# Patient Record
Sex: Female | Born: 1970 | Race: White | Hispanic: No | Marital: Married
Health system: Southern US, Community
[De-identification: ages and names within clinical notes are randomized; demographics above are authoritative.]

---

## 2013-09-06 ENCOUNTER — Other Ambulatory Visit (HOSPITAL_COMMUNITY): Payer: Medicare Other

## 2013-09-06 ENCOUNTER — Inpatient Hospital Stay
Admission: AD | Admit: 2013-09-06 | Discharge: 2013-09-28 | Disposition: A | Discharge status: Skilled Nursing Facility | Payer: Medicare Other | Source: Ambulatory Visit | Attending: Internal Medicine | Admitting: Internal Medicine

## 2013-09-06 DIAGNOSIS — J189 Pneumonia, unspecified organism: Secondary | ICD-10-CM

## 2013-09-06 DIAGNOSIS — J9620 Acute and chronic respiratory failure, unspecified whether with hypoxia or hypercapnia: Secondary | ICD-10-CM

## 2013-09-07 LAB — CBC
HEMATOCRIT: 33.8 % — AB (ref 36.0–46.0)
Hemoglobin: 10.5 g/dL — ABNORMAL LOW (ref 12.0–15.0)
MCH: 24 pg — ABNORMAL LOW (ref 26.0–34.0)
MCHC: 31.1 g/dL (ref 30.0–36.0)
MCV: 77.3 fL — AB (ref 78.0–100.0)
PLATELETS: 506 10*3/uL — AB (ref 150–400)
RBC: 4.37 MIL/uL (ref 3.87–5.11)
RDW: 17.9 % — ABNORMAL HIGH (ref 11.5–15.5)
WBC: 10.5 10*3/uL (ref 4.0–10.5)

## 2013-09-07 LAB — BASIC METABOLIC PANEL
BUN: 19 mg/dL (ref 6–23)
CALCIUM: 9.4 mg/dL (ref 8.4–10.5)
CHLORIDE: 100 meq/L (ref 96–112)
CO2: 26 mEq/L (ref 19–32)
Creatinine, Ser: 0.33 mg/dL — ABNORMAL LOW (ref 0.50–1.10)
GFR calc Af Amer: >90 mL/min (ref >90)
GFR calc non Af Amer: >90 mL/min (ref >90)
Glucose, Bld: 121 mg/dL — ABNORMAL HIGH (ref 70–99)
Potassium: 3.9 mEq/L (ref 3.7–5.3)
Sodium: 138 mEq/L (ref 137–147)

## 2013-09-07 LAB — BLOOD GAS, ARTERIAL
ACID-BASE EXCESS: 0.1 mmol/L (ref 0.0–2.0)
Bicarbonate: 24.1 mEq/L — ABNORMAL HIGH (ref 20.0–24.0)
Delivery systems: POSITIVE
Expiratory PAP: 5
FIO2: 30 %
Inspiratory PAP: 10
Mode: POSITIVE
O2 Saturation: 99.1 %
PCO2 ART: 37.2 mmHg (ref 35.0–45.0)
PO2 ART: 116 mmHg — AB (ref 80.0–100.0)
Patient temperature: 97.6
TCO2: 25.3 mmol/L (ref 0–100)
pH, Arterial: 7.424 (ref 7.350–7.450)

## 2013-09-07 LAB — HEPATIC FUNCTION PANEL
ALT: 36 U/L — ABNORMAL HIGH (ref 0–35)
AST: 13 U/L (ref 0–37)
Albumin: 3.4 g/dL — ABNORMAL LOW (ref 3.5–5.2)
Alkaline Phosphatase: 73 U/L (ref 39–117)
BILIRUBIN TOTAL: 0.4 mg/dL (ref 0.3–1.2)
Total Protein: 6.3 g/dL (ref 6.0–8.3)

## 2013-09-07 LAB — PROCALCITONIN: Procalcitonin: <0.1 ng/mL

## 2013-09-07 LAB — SEDIMENTATION RATE: SED RATE: 7 mm/h (ref 0–22)

## 2013-09-07 LAB — C-REACTIVE PROTEIN: CRP: <0.5 mg/dL — ABNORMAL LOW (ref <0.60)

## 2013-09-07 LAB — TSH: TSH: 0.297 u[IU]/mL — AB (ref 0.350–4.500)

## 2013-09-08 LAB — BASIC METABOLIC PANEL
BUN: 22 mg/dL (ref 6–23)
CHLORIDE: 101 meq/L (ref 96–112)
CO2: 30 mEq/L (ref 19–32)
Calcium: 9.3 mg/dL (ref 8.4–10.5)
Creatinine, Ser: 0.35 mg/dL — ABNORMAL LOW (ref 0.50–1.10)
GFR calc non Af Amer: >90 mL/min (ref >90)
Glucose, Bld: 99 mg/dL (ref 70–99)
POTASSIUM: 3.5 meq/L — AB (ref 3.7–5.3)
Sodium: 141 mEq/L (ref 137–147)

## 2013-09-08 LAB — POTASSIUM: POTASSIUM: 3.8 meq/L (ref 3.7–5.3)

## 2013-09-09 LAB — BASIC METABOLIC PANEL
BUN: 23 mg/dL (ref 6–23)
CHLORIDE: 100 meq/L (ref 96–112)
CO2: 31 mEq/L (ref 19–32)
CREATININE: 0.39 mg/dL — AB (ref 0.50–1.10)
Calcium: 9.1 mg/dL (ref 8.4–10.5)
GFR calc non Af Amer: >90 mL/min (ref >90)
Glucose, Bld: 85 mg/dL (ref 70–99)
POTASSIUM: 3.5 meq/L — AB (ref 3.7–5.3)
Sodium: 139 mEq/L (ref 137–147)

## 2013-09-10 NOTE — Progress Notes (Addendum)
[facility]                                                                                              Progress note     Patient Demographics  Lenise ArenaSusan Coletta, is a 43 y.o. female  ZOX:[id number]  WJX:[id number]:[id number]  DOB - 1971/05/05  Admit date - 09/06/2013  Admitting Physician Elnora MorrisonAhmad B Barakat, MD  Outpatient Primary MD for the patient is No PCP Per Patient  LOS - 4   Chief complaint Respiratory failure          Subjective:   Lenise ArenaSusan Redd today complains of lower extremity pains and tingling with mild nausea .   Objective:   Vital signs  Temperature 96.9 Heart rate 83 Respiratory rate 14 Blood pressure 98/74 Pulse ox 97%  Exam Awake Alert, Oriented X 3, No new F.N deficits,  [place].AT,PERRAL Supple Neck,No JVD, No cervical lymphadenopathy appriciated.  Symmetrical Chest wall movement, decreased breath sounds at the bases, mild scattered rhonchi RRR,No Gallops,Rubs or new Murmurs, No Parasternal Heave +ve B.Sounds, Abd Soft, Non tender, No organomegaly appriciated, No rebound - guarding or rigidity. No Cyanosis, Clubbing or edema, No new Rash or bruise     I&Os 1435/300  Foley, no   Data Review   CBC  Recent Labs Lab 09/07/13 0640  WBC 10.5  HGB 10.5*  HCT 33.8*  PLT 506*  MCV 77.3*  MCH 24.0*  MCHC 31.1  RDW 17.9*    Chemistries   Recent Labs Lab 09/07/13 0640 09/08/13 0630 09/08/13 1450 09/09/13 0625  NA 138 141  --  139  K 3.9 3.5* 3.8 3.5*  CL 100 101  --  100  CO2 26 30  --  31  GLUCOSE 121* 99  --  85  BUN 19 22  --  23  CREATININE 0.33* 0.35*  --  0.39*  CALCIUM 9.4 9.3  --  9.1  AST 13  --   --   --   ALT 36*  --   --   --   ALKPHOS 73  --   --   --   BILITOT 0.4  --   --   --      Assessment & Plan   Respiratory failure, acute on 2 L nasal cannula in a.m. 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 Increase OxyIR to 10 mg every 4 hours  Code Status:Full    DVT Prophylaxis  Lovenox   Carron CurieHijazi, Ancelmo Hunt M.D on 09/10/2013 at 9:52 AM

## 2013-09-11 LAB — BASIC METABOLIC PANEL
BUN: 13 mg/dL (ref 6–23)
CO2: 31 mEq/L (ref 19–32)
Calcium: 9.2 mg/dL (ref 8.4–10.5)
Chloride: 99 mEq/L (ref 96–112)
Creatinine, Ser: 0.38 mg/dL — ABNORMAL LOW (ref 0.50–1.10)
GFR calc non Af Amer: >90 mL/min (ref >90)
Glucose, Bld: 87 mg/dL (ref 70–99)
Potassium: 3.5 mEq/L — ABNORMAL LOW (ref 3.7–5.3)
Sodium: 140 mEq/L (ref 137–147)

## 2013-09-11 LAB — BLOOD GAS, ARTERIAL
ACID-BASE EXCESS: 3.8 mmol/L — AB (ref 0.0–2.0)
Bicarbonate: 28.9 mEq/L — ABNORMAL HIGH (ref 20.0–24.0)
Delivery systems: POSITIVE
Expiratory PAP: 5
FIO2: 0.3 %
Inspiratory PAP: 12
MODE: POSITIVE
O2 SAT: 96.6 %
PATIENT TEMPERATURE: 98.6
PCO2 ART: 52.6 mmHg — AB (ref 35.0–45.0)
TCO2: 30.5 mmol/L (ref 0–100)
pH, Arterial: 7.359 (ref 7.350–7.450)
pO2, Arterial: 76.7 mmHg — ABNORMAL LOW (ref 80.0–100.0)

## 2013-09-11 LAB — CBC
HEMATOCRIT: 34.3 % — AB (ref 36.0–46.0)
Hemoglobin: 10.5 g/dL — ABNORMAL LOW (ref 12.0–15.0)
MCH: 24.6 pg — ABNORMAL LOW (ref 26.0–34.0)
MCHC: 30.6 g/dL (ref 30.0–36.0)
MCV: 80.5 fL (ref 78.0–100.0)
Platelets: 414 10*3/uL — ABNORMAL HIGH (ref 150–400)
RBC: 4.26 MIL/uL (ref 3.87–5.11)
RDW: 18.8 % — AB (ref 11.5–15.5)
WBC: 13.1 10*3/uL — ABNORMAL HIGH (ref 4.0–10.5)

## 2013-09-11 NOTE — Progress Notes (Signed)
[facility]                                                                                              Progress note     Patient Demographics  Lauren Alvarado, is a 43 y.o. female  YNW:[id number]  MVH:[id number]  DOB - 04-05-1971  Admit date - 09/06/2013  Admitting Physician Elnora MorrisonAhmad B Barakat, MD  Outpatient Primary MD for the patient is No PCP Per Patient  LOS - 5   Chief complaint Respiratory failure          Subjective:   Lauren Alvarado today reports improvement in her pain and nausea .   Objective:   Vital signs  Temperature 96.9  Heart rate 83 Respiratory rate 14 Blood pressure 98/74 Pulse ox 97%  Exam Awake Alert, Oriented X 3, No new F.N deficits,  [place].AT,PERRAL Supple Neck,No JVD, No cervical lymphadenopathy appriciated.  Symmetrical Chest wall movement, decreased breath sounds at the bases, mild scattered rhonchi RRR,No Gallops,Rubs or new Murmurs, No Parasternal Heave +ve B.Sounds, Abd Soft, Non tender, No organomegaly appriciated, No rebound - guarding or rigidity. No Cyanosis, Clubbing or edema, No new Rash or bruise     I&Os 1435/300  Foley, no   Data Review   CBC  Recent Labs Lab 09/07/13 0640 09/11/13 0800  WBC 10.5 13.1*  HGB 10.5* 10.5*  HCT 33.8* 34.3*  PLT 506* 414*  MCV 77.3* 80.5  MCH 24.0* 24.6*  MCHC 31.1 30.6  RDW 17.9* 18.8*    Chemistries   Recent Labs Lab 09/07/13 0640 09/08/13 0630 09/08/13 1450 09/09/13 0625 09/11/13 0800  NA 138 141  --  139 140  K 3.9 3.5* 3.8 3.5* 3.5*  CL 100 101  --  100 99  CO2 26 30  --  31 31  GLUCOSE 121* 99  --  85 87  BUN 19 22  --  23 13  CREATININE 0.33* 0.35*  --  0.39* 0.38*  CALCIUM 9.4 9.3  --  9.1 9.2  AST 13  --   --   --   --   ALT 36*  --   --   --   --   ALKPHOS 73  --   --   --   --   BILITOT 0.4  --   --   --   --      Assessment & Plan   Respiratory failure, acute on 2 L nasal  cannula in a.m. 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

## 2013-09-12 NOTE — Progress Notes (Signed)
[facility]                                                                                              Progress note     Patient Demographics  Lauren Alvarado, is a 43 y.o. female  ZOX:[id number]  WJX:[id number]  DOB - [birth year]  Admit date - 09/06/2013  Admitting Physician Lauren MorrisonAhmad B Barakat, MD  Outpatient Primary MD for the patient is No PCP Per Patient  LOS - 6   Chief complaint Respiratory failure          Subjective:   Lauren Alvarado today reports improvement in her pain and nausea .   Objective:   Vital signs  Temperature 96.30  Heart rate 83 Respiratory rate 14 Blood pressure 118/72 Pulse ox 99%  Exam Awake Alert, Oriented X 3, No new F.N deficits,  [place].AT,PERRAL Supple Neck,No JVD, No cervical lymphadenopathy appriciated.  Symmetrical Chest wall movement, decreased breath sounds at the bases, mild scattered rhonchi RRR,No Gallops,Rubs or new Murmurs, No Parasternal Heave +ve B.Sounds, Abd Soft, Non tender, No organomegaly appriciated, No rebound - guarding or rigidity. No Cyanosis, Clubbing or edema, No new Rash or bruise     I&Os unknown  Foley, no   Data Review   CBC  Recent Labs Lab 09/07/13 0640 09/11/13 0800  WBC 10.5 13.1*  HGB 10.5* 10.5*  HCT 33.8* 34.3*  PLT 506* 414*  MCV 77.3* 80.5  MCH 24.0* 24.6*  MCHC 31.1 30.6  RDW 17.9* 18.8*    Chemistries   Recent Labs Lab 09/07/13 0640 09/08/13 0630 09/08/13 1450 09/09/13 0625 09/11/13 0800  NA 138 141  --  139 140  K 3.9 3.5* 3.8 3.5* 3.5*  CL 100 101  --  100 99  CO2 26 30  --  31 31  GLUCOSE 121* 99  --  85 87  BUN 19 22  --  23 13  CREATININE 0.33* 0.35*  --  0.39* 0.38*  CALCIUM 9.4 9.3  --  9.1 9.2  AST 13  --   --   --   --   ALT 36*  --   --   --   --   ALKPHOS 73  --   --   --   --   BILITOT 0.4  --   --   --   --      Assessment & Plan   Respiratory failure, acute on 2 L  nasal cannula in a.m. 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

## 2013-09-13 NOTE — Progress Notes (Signed)
[facility]                                                                                              Progress note     Patient Demographics  Lauren Alvarado, is a 43 y.o. female  BJY:[name]  YQM:[id number]:[id number]  DOB - Mar 21, 1971  Admit date - 09/06/2013  Admitting Physician Elnora MorrisonAhmad B Barakat, MD  Outpatient Primary MD for the patient is No PCP Per Patient  LOS - 7   Chief complaint Respiratory failure          Subjective:   Lauren ArenaSusan Foutz today reports improvement in her pain and nausea . However complains of bilateral knee pain today  Objective:   Vital signs  Temperature 97.60  Heart rate 105 Respiratory rate 16 Blood pressure 102/68 Pulse ox 97%  Exam Awake Alert, Oriented X 3, No new F.N deficits,  [place].AT,PERRAL Supple Neck,No JVD, No cervical lymphadenopathy appriciated.  Symmetrical Chest wall movement, decreased breath sounds at the bases, mild scattered rhonchi RRR,No Gallops,Rubs or new Murmurs, No Parasternal Heave +ve B.Sounds, Abd Soft, Non tender, No organomegaly appriciated, No rebound - guarding or rigidity. No Cyanosis, Clubbing or edema, No new Rash or bruise   Knee is examined no effusion noted , no hotness or redness   I&Os 1250/?? Foley, no   Data Review   CBC  Recent Labs Lab 09/07/13 0640 09/11/13 0800  WBC 10.5 13.1*  HGB 10.5* 10.5*  HCT 33.8* 34.3*  PLT 506* 414*  MCV 77.3* 80.5  MCH 24.0* 24.6*  MCHC 31.1 30.6  RDW 17.9* 18.8*    Chemistries   Recent Labs Lab 09/07/13 0640 09/08/13 0630 09/08/13 1450 09/09/13 0625 09/11/13 0800  NA 138 141  --  139 140  K 3.9 3.5* 3.8 3.5* 3.5*  CL 100 101  --  100 99  CO2 26 30  --  31 31  GLUCOSE 121* 99  --  85 87  BUN 19 22  --  23 13  CREATININE 0.33* 0.35*  --  0.39* 0.38*  CALCIUM 9.4 9.3  --  9.1 9.2  AST 13  --   --   --   --   ALT 36*  --   --   --   --   ALKPHOS 73  --   --   --    --   BILITOT 0.4  --   --   --   --      Assessment & Plan   Respiratory failure, acute on 2 L nasal cannula in a.m. 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

## 2013-09-14 LAB — CBC
HEMATOCRIT: 33.5 % — AB (ref 36.0–46.0)
Hemoglobin: 10.5 g/dL — ABNORMAL LOW (ref 12.0–15.0)
MCH: 25.1 pg — ABNORMAL LOW (ref 26.0–34.0)
MCHC: 31.3 g/dL (ref 30.0–36.0)
MCV: 80.1 fL (ref 78.0–100.0)
Platelets: 362 10*3/uL (ref 150–400)
RBC: 4.18 MIL/uL (ref 3.87–5.11)
RDW: 18.9 % — AB (ref 11.5–15.5)
WBC: 24.6 10*3/uL — AB (ref 4.0–10.5)

## 2013-09-14 LAB — MAGNESIUM: Magnesium: 1.8 mg/dL (ref 1.5–2.5)

## 2013-09-14 LAB — BASIC METABOLIC PANEL
BUN: 19 mg/dL (ref 6–23)
CHLORIDE: 95 meq/L — AB (ref 96–112)
CO2: 25 mEq/L (ref 19–32)
Calcium: 8.9 mg/dL (ref 8.4–10.5)
Creatinine, Ser: 0.39 mg/dL — ABNORMAL LOW (ref 0.50–1.10)
GFR calc Af Amer: >90 mL/min (ref >90)
GFR calc non Af Amer: >90 mL/min (ref >90)
Glucose, Bld: 138 mg/dL — ABNORMAL HIGH (ref 70–99)
POTASSIUM: 3.8 meq/L (ref 3.7–5.3)
SODIUM: 136 meq/L — AB (ref 137–147)

## 2013-09-14 NOTE — Progress Notes (Addendum)
[facility]                                                                                              Progress note     Patient Demographics  Lauren Alvarado, is a [age] y.o. female  ZOX:[id number]  WJX:[id number]  DOB - 10/31/1970  Admit date - 09/06/2013  Admitting Physician Elnora MorrisonAhmad B Barakat, MD  Outpatient Primary MD for the patient is No PCP Per Patient  LOS - 8   Chief complaint Respiratory failure          Subjective:   Lauren Alvarado today reports improvement in her pain and nausea . Still complaining of bilateral knee pain, lidocaine patches are still pending.-Heart rate started increasing now. Will check EKG  Objective:   Vital signs  Temperature 97.60  Heart rate 70 Respiratory rate 15 Blood pressure 112/57 Pulse ox 92%  Exam Awake Alert, Oriented X 3, No new F.N deficits,  [place].AT,PERRAL Supple Neck,No JVD, No cervical lymphadenopathy appriciated.  Symmetrical Chest wall movement, decreased breath sounds at the bases, mild scattered rhonchi RRR,No Gallops,Rubs or new Murmurs, No Parasternal Heave +ve B.Sounds, Abd Soft, Non tender, No organomegaly appriciated, No rebound - guarding or rigidity. No Cyanosis, Clubbing or edema, No new Rash or bruise   Knee is examined no effusion noted , no hotness or redness   I&Os unknown Foley, no   Data Review   CBC  Recent Labs Lab 09/11/13 0800  WBC 13.1*  HGB 10.5*  HCT 34.3*  PLT 414*  MCV 80.5  MCH 24.6*  MCHC 30.6  RDW 18.8*    Chemistries   Recent Labs Lab 09/08/13 0630 09/08/13 1450 09/09/13 0625 09/11/13 0800  NA 141  --  139 140  K 3.5* 3.8 3.5* 3.5*  CL 101  --  100 99  CO2 30  --  31 31  GLUCOSE 99  --  85 87  BUN 22  --  23 13  CREATININE 0.35*  --  0.39* 0.38*  CALCIUM 9.3  --  9.1 9.2    EKG sinus tach   Assessment & Plan   Respiratory failure, acute on 2 L nasal cannula in a.m. 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 SVT by monitor  Plan Continue same treatment  check CBC/ BMP today Lopressor IV when necessary code Status:Full Critical care time 34 minutes    DVT Prophylaxis  Lovenox   Carron CurieHijazi, Leoda Smithhart M.D on 09/14/2013 at 10:21 AM

## 2013-09-15 ENCOUNTER — Other Ambulatory Visit (HOSPITAL_COMMUNITY): Payer: Medicare Other

## 2013-09-15 LAB — BASIC METABOLIC PANEL
BUN: 17 mg/dL (ref 6–23)
CHLORIDE: 101 meq/L (ref 96–112)
CO2: 30 mEq/L (ref 19–32)
Calcium: 9 mg/dL (ref 8.4–10.5)
Creatinine, Ser: 0.42 mg/dL — ABNORMAL LOW (ref 0.50–1.10)
GFR calc Af Amer: >90 mL/min (ref >90)
GFR calc non Af Amer: >90 mL/min (ref >90)
Glucose, Bld: 88 mg/dL (ref 70–99)
POTASSIUM: 4.4 meq/L (ref 3.7–5.3)
SODIUM: 142 meq/L (ref 137–147)

## 2013-09-15 LAB — CLOSTRIDIUM DIFFICILE BY PCR: CDIFFPCR: NEGATIVE

## 2013-09-15 LAB — TSH: TSH: 2.79 u[IU]/mL (ref 0.350–4.500)

## 2013-09-17 LAB — COMPREHENSIVE METABOLIC PANEL
ALT: 19 U/L (ref 0–35)
AST: 8 U/L (ref 0–37)
Albumin: 2.2 g/dL — ABNORMAL LOW (ref 3.5–5.2)
Alkaline Phosphatase: 67 U/L (ref 39–117)
BUN: 8 mg/dL (ref 6–23)
CALCIUM: 8.7 mg/dL (ref 8.4–10.5)
CHLORIDE: 102 meq/L (ref 96–112)
CO2: 29 meq/L (ref 19–32)
Creatinine, Ser: 0.45 mg/dL — ABNORMAL LOW (ref 0.50–1.10)
GFR calc Af Amer: >90 mL/min (ref >90)
Glucose, Bld: 110 mg/dL — ABNORMAL HIGH (ref 70–99)
Potassium: 4.1 mEq/L (ref 3.7–5.3)
SODIUM: 142 meq/L (ref 137–147)
Total Bilirubin: 0.3 mg/dL (ref 0.3–1.2)
Total Protein: 5.4 g/dL — ABNORMAL LOW (ref 6.0–8.3)

## 2013-09-17 LAB — CBC WITH DIFFERENTIAL/PLATELET
Basophils Absolute: 0 10*3/uL (ref 0.0–0.1)
Basophils Relative: 0 % (ref 0–1)
EOS PCT: 1 % (ref 0–5)
Eosinophils Absolute: 0.2 10*3/uL (ref 0.0–0.7)
HCT: 27.4 % — ABNORMAL LOW (ref 36.0–46.0)
Hemoglobin: 8.5 g/dL — ABNORMAL LOW (ref 12.0–15.0)
Lymphocytes Relative: 4 % — ABNORMAL LOW (ref 12–46)
Lymphs Abs: 0.7 10*3/uL (ref 0.7–4.0)
MCH: 25.2 pg — ABNORMAL LOW (ref 26.0–34.0)
MCHC: 31 g/dL (ref 30.0–36.0)
MCV: 81.3 fL (ref 78.0–100.0)
MONOS PCT: 7 % (ref 3–12)
Monocytes Absolute: 1.3 10*3/uL — ABNORMAL HIGH (ref 0.1–1.0)
NEUTROS PCT: 88 % — AB (ref 43–77)
Neutro Abs: 16.5 10*3/uL — ABNORMAL HIGH (ref 1.7–7.7)
Platelets: 245 10*3/uL (ref 150–400)
RBC: 3.37 MIL/uL — AB (ref 3.87–5.11)
RDW: 18.6 % — AB (ref 11.5–15.5)
WBC: 18.7 10*3/uL — ABNORMAL HIGH (ref 4.0–10.5)

## 2013-09-17 LAB — PREALBUMIN: Prealbumin: 9.7 mg/dL — ABNORMAL LOW (ref 17.0–34.0)

## 2013-09-18 ENCOUNTER — Other Ambulatory Visit (HOSPITAL_COMMUNITY): Payer: Medicare Other

## 2013-09-19 LAB — BASIC METABOLIC PANEL
BUN: 10 mg/dL (ref 6–23)
CALCIUM: 9.3 mg/dL (ref 8.4–10.5)
CHLORIDE: 99 meq/L (ref 96–112)
CO2: 31 meq/L (ref 19–32)
CREATININE: 0.46 mg/dL — AB (ref 0.50–1.10)
GFR calc Af Amer: >90 mL/min (ref >90)
GFR calc non Af Amer: >90 mL/min (ref >90)
GLUCOSE: 114 mg/dL — AB (ref 70–99)
Potassium: 3.7 mEq/L (ref 3.7–5.3)
Sodium: 142 mEq/L (ref 137–147)

## 2013-09-19 LAB — CBC WITH DIFFERENTIAL/PLATELET
BASOS PCT: 0 % (ref 0–1)
Basophils Absolute: 0 10*3/uL (ref 0.0–0.1)
EOS PCT: 2 % (ref 0–5)
Eosinophils Absolute: 0.3 10*3/uL (ref 0.0–0.7)
HEMATOCRIT: 27.9 % — AB (ref 36.0–46.0)
Hemoglobin: 8.3 g/dL — ABNORMAL LOW (ref 12.0–15.0)
LYMPHS ABS: 1.2 10*3/uL (ref 0.7–4.0)
Lymphocytes Relative: 8 % — ABNORMAL LOW (ref 12–46)
MCH: 25 pg — ABNORMAL LOW (ref 26.0–34.0)
MCHC: 29.7 g/dL — ABNORMAL LOW (ref 30.0–36.0)
MCV: 84 fL (ref 78.0–100.0)
MONOS PCT: 6 % (ref 3–12)
Monocytes Absolute: 0.9 10*3/uL (ref 0.1–1.0)
NEUTROS ABS: 12.2 10*3/uL — AB (ref 1.7–7.7)
Neutrophils Relative %: 84 % — ABNORMAL HIGH (ref 43–77)
Platelets: 308 10*3/uL (ref 150–400)
RBC: 3.32 MIL/uL — AB (ref 3.87–5.11)
RDW: 18.5 % — ABNORMAL HIGH (ref 11.5–15.5)
WBC: 14.6 10*3/uL — AB (ref 4.0–10.5)

## 2013-09-20 ENCOUNTER — Other Ambulatory Visit (HOSPITAL_COMMUNITY): Payer: Self-pay

## 2013-09-20 ENCOUNTER — Other Ambulatory Visit (HOSPITAL_COMMUNITY): Payer: Medicare Other

## 2013-09-20 LAB — BLOOD GAS, ARTERIAL
ACID-BASE EXCESS: 4.3 mmol/L — AB (ref 0.0–2.0)
ACID-BASE EXCESS: 4.6 mmol/L — AB (ref 0.0–2.0)
ACID-BASE EXCESS: 4.8 mmol/L — AB (ref 0.0–2.0)
Acid-Base Excess: 4.7 mmol/L — ABNORMAL HIGH (ref 0.0–2.0)
BICARBONATE: 31.8 meq/L — AB (ref 20.0–24.0)
BICARBONATE: 31.1 meq/L — AB (ref 20.0–24.0)
Bicarbonate: 30.3 mEq/L — ABNORMAL HIGH (ref 20.0–24.0)
Bicarbonate: 29.9 mEq/L — ABNORMAL HIGH (ref 20.0–24.0)
DELIVERY SYSTEMS: POSITIVE
Expiratory PAP: 5
FIO2: 40 %
FIO2: 1 %
FIO2: 0.5 %
Inspiratory PAP: 14
MECHVT: 550 mL
MODE: POSITIVE
O2 Content: 10 L/min
O2 SAT: 99.3 %
O2 Saturation: 92.3 %
O2 Saturation: 100 %
O2 Saturation: 98.3 %
PATIENT TEMPERATURE: 98.7
PATIENT TEMPERATURE: 98.6
PATIENT TEMPERATURE: 98.6
PCO2 ART: 59.5 mmHg — AB (ref 35.0–45.0)
PCO2 ART: 68.7 mmHg — AB (ref 35.0–45.0)
PEEP: 5 cmH2O
PEEP: 5 cmH2O
PH ART: 7.327 — AB (ref 7.350–7.450)
PO2 ART: 113 mmHg — AB (ref 80.0–100.0)
PO2 ART: 125 mmHg — AB (ref 80.0–100.0)
Patient temperature: 98.7
RATE: 12 resp/min
RATE: 12 resp/min
TCO2: 34.3 mmol/L (ref 0–100)
TCO2: 32.1 mmol/L (ref 0–100)
TCO2: 31.6 mmol/L (ref 0–100)
TCO2: 33.2 mmol/L (ref 0–100)
pCO2 arterial: 82.8 mmHg (ref 35.0–45.0)
pCO2 arterial: 56 mmHg — ABNORMAL HIGH (ref 35.0–45.0)
pH, Arterial: 7.209 — ABNORMAL LOW (ref 7.350–7.450)
pH, Arterial: 7.347 — ABNORMAL LOW (ref 7.350–7.450)
pH, Arterial: 7.278 — ABNORMAL LOW (ref 7.350–7.450)
pO2, Arterial: 67.7 mmHg — ABNORMAL LOW (ref 80.0–100.0)
pO2, Arterial: 318 mmHg — ABNORMAL HIGH (ref 80.0–100.0)

## 2013-09-20 LAB — CBC
HCT: 28 % — ABNORMAL LOW (ref 36.0–46.0)
Hemoglobin: 8.4 g/dL — ABNORMAL LOW (ref 12.0–15.0)
MCH: 24.9 pg — ABNORMAL LOW (ref 26.0–34.0)
MCHC: 30 g/dL (ref 30.0–36.0)
MCV: 82.8 fL (ref 78.0–100.0)
Platelets: 301 10*3/uL (ref 150–400)
RBC: 3.38 MIL/uL — AB (ref 3.87–5.11)
RDW: 18.3 % — ABNORMAL HIGH (ref 11.5–15.5)
WBC: 9.1 10*3/uL (ref 4.0–10.5)

## 2013-09-20 LAB — BASIC METABOLIC PANEL
BUN: 12 mg/dL (ref 6–23)
CO2: 30 meq/L (ref 19–32)
CREATININE: 0.39 mg/dL — AB (ref 0.50–1.10)
Calcium: 9.3 mg/dL (ref 8.4–10.5)
Chloride: 96 mEq/L (ref 96–112)
GFR calc Af Amer: >90 mL/min (ref >90)
Glucose, Bld: 110 mg/dL — ABNORMAL HIGH (ref 70–99)
Potassium: 4 mEq/L (ref 3.7–5.3)
Sodium: 141 mEq/L (ref 137–147)

## 2013-09-20 NOTE — Consult Note (Signed)
Name: Lauren Alvarado MRN: [id number] DOB: 08-01-1970    ADMISSION DATE:  09/06/2013 CONSULTATION DATE:  09/20/13 REFERRING MD :  Dr. Arnette NorrisBarakat [facility]  CHIEF COMPLAINT:  Acute on Chronic Respiratory Failure   BRIEF PATIENT DESCRIPTION: 43 [facility] post admit for HCAP in the setting of known COPD, MAI, & Chronic Pain.    SIGNIFICANT EVENTS: 3/31 - 3/26:  [facility], found to have hypercarbic respiratory fx 2/2  HCAP  Required intubation.  Extubated prior to d/c ................................................................................................................................................................................ 3/26 - [facility] [place] for cardiopulmonary rehab efforts 4/07 - Noted to have increased O2 needs 4/09 - Failed bipap, intubated per Dr. Arnette NorrisBarakat  STUDIES:    LINES / TUBES: OETT 4/9>>> R CW Port 4/9>>>  CULTURES: Sputum Culture 4/9>>> BCx2 4/8>>>  ANTIBIOTICS: Per [facility], See MAR Vanco 4/9>>> Meropenem 4/8>>>   HISTORY OF PRESENT ILLNESS:  43 y/o F, former smoker, with PMH of Mycobacterium Avium, COPD, severe bullous emphysema, Home oxygen dependent, Neurofibromatosis, Chronic LE pain - followed at a pain clinic, Pulmonary HTN, Tachycardia who was originally [facility] from 3/31-3/26 for HCAP requiring intubation. [facility]ulmonary [facility] efforts.  Patient had worsening O2 needs 4/6-4/8 and was placed on bipap.  4/9 patient had progressive work of breathing and hypercarbia.  Intubated per Dr. Arnette NorrisBarakat & PCCM called for consultation.   PAST MEDICAL / SURGICAL HISTORY :  Mycobacterium Avium  COPD Home oxygen dependent Neurofibromatosis Chronic LE pain - followed at a pain clinic Pulmonary HTN Tachycardia   ALLERGIES:  NKDA  MEDICATIONS PTA: Norco 5/325 Q8  PRN Biazin (completed) Duoneb Meropenem (completed) Metoprolol 25mg  BID Rizatriptan 10 mg QD Topamax 100 mg QHS Vancomycin (completed Xanax 0.25 TID PRN Prednisone taper Cardizem 120 mg QD Ethambutol 800 mg QD Rifabutin 600 mg QD   FAMILY HISTORY:  F:  Lung cancer.  M:  DM, HTN, HLD.  S:  CAD  SOCIAL HISTORY:  Former smoker (quit 2011), 20 pk years.  No ETOH.  Lives with mother, on 2L O2 at baseline pta.  Disabled.    REVIEW OF SYSTEMS:  Unable to complete as pt is altered on vent.  SUBJECTIVE: RN reports pt became progressively more short of breath.  Earlier ABG with hypercarbia. Decision for intubation   VITAL SIGNS: Reviewed at bedside.  WNL  PHYSICAL EXAMINATION: General:  Chronically ill in NAD Neuro:  Sedate, no distress HEENT:  OETT, mm pink/moist Cardiovascular:  s1s2 rrr, no m/r/g Lungs:  resp's even/non-labored, lungs bilaterally with rhonchi, few faint wheezes Abdomen:  Round/soft, bsx4 active  Musculoskeletal:  No acute deformities  Skin:  Warm/dry, no edema   Recent Labs Lab 09/17/13 0515 09/19/13 0710 09/20/13 0433  NA 142 142 141  K 4.1 3.7 4.0  CL 102 99 96  CO2 29 31 30   BUN 8 10 12   CREATININE 0.45* 0.46* 0.39*  GLUCOSE 110* 114* 110*    Recent Labs Lab 09/17/13 0515 09/19/13 0710 09/20/13 0433  HGB 8.5* 8.3* 8.4*  HCT 27.4* 27.9* 28.0*  WBC 18.7* 14.6* 9.1  PLT 245 308 301   Dg Chest Port 1 View  09/20/2013   CLINICAL DATA:  Possible pneumonia  EXAM: PORTABLE CHEST - 1 VIEW  COMPARISON:  Portable chest x-ray of 09/18/2013  FINDINGS: There is parenchymal opacity in the right mid upper lung  field suspicious for pneumonia and followup is recommended. Probable bullous change in the right apex is stable and right Port-A-Cath remains. The left lung is clear. Heart size is stable. Biapical pleural thickening right greater than left is unchanged.  IMPRESSION: More opacity in the right mid lung suspicious for pneumonia. Recommend continued  followup.   Electronically Signed   By: Dwyane Dee M.D.   On: 09/20/2013 08:12   Dg Chest Port 1 View  09/18/2013   CLINICAL DATA:  Respiratory failure, follow-up of pneumonia  EXAM: PORTABLE CHEST - 1 VIEW  COMPARISON:  DG CHEST 1V PORT dated 09/15/2013; DG CHEST 1V PORT dated 09/06/2013  FINDINGS: The lungs are hyperinflated. A large stable appearing bullous lesion in the left upper hemithorax is again demonstrated. There is extensive scarring within both lungs. There is new increased density in the left mid lung worrisome for pneumonia. The cardiac silhouette is normal in size. The pulmonary vascularity is not engorged. Stable biapical pleural thickening is present greater on the right than on the left. The power port type appliance appears unchanged.  IMPRESSION: New increased density in the left mid lung is worrisome for pneumonia. This is superimposed on findings of severe bullous emphysema and biapical pleural scarring.   Electronically Signed   By: David  [place]   On: 09/18/2013 16:18    ASSESSMENT / PLAN:  Acute on Chronic Respiratory Failure MAI  COPD Pulmonary HTN   Plan: -full vent support, 550 / 12 / 5 / adjust fiO2 to support sats >88% -f/u ABG -trend CXR -continue solumedrol -continue scheduled duoneb -abx per ID  -if this is indeed her third intubation, (confirmed 2 per documentation), will need to discuss with patient future goals of care -daily SBT / WUA -PRN sedation only, allow pt to wake  -consider CT scan to further evaluate parenchyma if not improving     Canary Brim, NP-C [facility] Pgr: [phone] or [phone]    09/20/2013, 2:51 PM  Attending:  I have seen and examined the patient with nurse practitioner/resident and agree with the note above.   Looks like HCAP with chronic hypercapnic respiratory failure acutely decompensated.  Assume decompensation was from not using BIPAP, HCAP, and possibly some degree of AE COPD  Continue full  vent support  Previously on lung transplant list? Would suggest end stage COPD.  Sounds like third go-round of mechanical ventilation which would normally indicate tracheostomy.  However if she has end stage disease we would need to discuss goals of care with family more before considering tracheostomy (likely not a good candidate for long term vent).  Heber Lozano, MD [place] [facility] Pager: [phone] Cell: [phone] If no response, call [phone]

## 2013-09-21 ENCOUNTER — Other Ambulatory Visit (HOSPITAL_COMMUNITY): Payer: Medicare Other

## 2013-09-21 LAB — BASIC METABOLIC PANEL
BUN: 12 mg/dL (ref 6–23)
CALCIUM: 9.6 mg/dL (ref 8.4–10.5)
CO2: 28 meq/L (ref 19–32)
CREATININE: 0.36 mg/dL — AB (ref 0.50–1.10)
Chloride: 98 mEq/L (ref 96–112)
GFR calc Af Amer: >90 mL/min (ref >90)
GFR calc non Af Amer: >90 mL/min (ref >90)
Glucose, Bld: 82 mg/dL (ref 70–99)
Potassium: 4.9 mEq/L (ref 3.7–5.3)
Sodium: 140 mEq/L (ref 137–147)

## 2013-09-21 LAB — BLOOD GAS, ARTERIAL
Acid-Base Excess: 6.7 mmol/L — ABNORMAL HIGH (ref 0.0–2.0)
Acid-Base Excess: 8.4 mmol/L — ABNORMAL HIGH (ref 0.0–2.0)
Bicarbonate: 33.6 mEq/L — ABNORMAL HIGH (ref 20.0–24.0)
Bicarbonate: 34.3 mEq/L — ABNORMAL HIGH (ref 20.0–24.0)
FIO2: 0.5 %
FIO2: 0.4 %
O2 Saturation: 99.4 %
O2 Saturation: 97.4 %
PATIENT TEMPERATURE: 98.6
PATIENT TEMPERATURE: 97.1
PEEP/CPAP: 5 cmH2O
PEEP: 5 cmH2O
PH ART: 7.252 — AB (ref 7.350–7.450)
PH ART: 7.337 — AB (ref 7.350–7.450)
PRESSURE SUPPORT: 5 cmH2O
RATE: 15 resp/min
TCO2: 36 mmol/L (ref 0–100)
TCO2: 36.4 mmol/L (ref 0–100)
VT: 450 mL
pCO2 arterial: 79 mmHg (ref 35.0–45.0)
pCO2 arterial: 65.1 mmHg (ref 35.0–45.0)
pO2, Arterial: 124 mmHg — ABNORMAL HIGH (ref 80.0–100.0)
pO2, Arterial: 101 mmHg — ABNORMAL HIGH (ref 80.0–100.0)

## 2013-09-21 LAB — CBC
HCT: 29.2 % — ABNORMAL LOW (ref 36.0–46.0)
Hemoglobin: 8.8 g/dL — ABNORMAL LOW (ref 12.0–15.0)
MCH: 24.4 pg — AB (ref 26.0–34.0)
MCHC: 30.1 g/dL (ref 30.0–36.0)
MCV: 81.1 fL (ref 78.0–100.0)
PLATELETS: 231 10*3/uL (ref 150–400)
RBC: 3.6 MIL/uL — ABNORMAL LOW (ref 3.87–5.11)
RDW: 18.6 % — AB (ref 11.5–15.5)
WBC: 9.8 10*3/uL (ref 4.0–10.5)

## 2013-09-21 NOTE — Progress Notes (Signed)
Name: Lauren Alvarado MRN: [id number] DOB: Feb 19, 1971    ADMISSION DATE:  09/06/2013 CONSULTATION DATE:  09/20/13 REFERRING MD :  Dr. Arnette Norris PRIMARY SERVICE:  [facility]  CHIEF COMPLAINT:  Acute on Chronic Respiratory Failure   BRIEF PATIENT DESCRIPTION: 43 y/o F admitted to [facility] from [facility] for rehab efforts post admit for HCAP in the setting of known COPD, MAI, & Chronic Pain.    SIGNIFICANT EVENTS: 3/31 - 3/26:  Admit to [facility] for SOB, found to have hypercarbic respiratory fx 2/2  HCAP  Required intubation.  Extubated prior to d/c ................................................................................................................................................................................ 3/26 - Admit to [facility] on 3L per [place] for cardiopulmonary rehab efforts 4/07 - Noted to have increased O2 needs 4/09 - Failed bipap, intubated per Dr. Arnette Norris 4/10 - Wide awake on vent, ABG 7.25, pcO2 79 (?validity) - PSV 5/5 with ABG in 30 min  STUDIES:    LINES / TUBES: OETT 4/9>>> R CW Port 4/9>>>  CULTURES: Sputum Culture 4/9>>> BCx2 4/8>>>  ANTIBIOTICS: Per SSH, See MAR Vanco 4/9>>> Meropenem 4/8>>>  VITAL SIGNS: Reviewed at bedside.  WNL   PHYSICAL EXAMINATION: General:  Chronically ill in NAD Neuro:  Awake, alert, no distress.  Attempts to get out of bed intermittently HEENT:  OETT, mm pink/moist Cardiovascular:  s1s2 rrr, no m/r/g Lungs:  resp's even/non-labored, lungs bilaterally with rhonchi, few faint wheezes Abdomen:  Round/soft, bsx4 active  Musculoskeletal:  No acute deformities  Skin:  Warm/dry, no edema   Recent Labs Lab 09/19/13 0710 09/20/13 0433 09/21/13 0726  NA 142 141 140  K 3.7 4.0 4.9  CL 99 96 98  CO2 31 30 28   BUN 10 12 12   CREATININE 0.46* 0.39* 0.36*  GLUCOSE 114* 110* 82    Recent Labs Lab 09/19/13 0710 09/20/13 0433 09/21/13 0726  HGB 8.3* 8.4* 8.8*  HCT 27.9* 28.0* 29.2*  WBC 14.6* 9.1 9.8  PLT 308  301 231   Dg Chest Port 1 View  09/21/2013   CLINICAL DATA:  Check endotracheal tube placement  EXAM: PORTABLE CHEST - 1 VIEW  COMPARISON:  09/20/2013  FINDINGS: The cardiac shadow is stable. A dual lumen chest wall port is again seen on the right. An endotracheal tube is noted 3 cm above the carina. A nasogastric catheter is seen coursing into the stomach. Bullous changes are noted in the apices bilaterally. Some adjacent scarring is noted in the right upper lobe stable from the prior exam. The overall appearance is stable from the previous study. No new focal infiltrate is seen.  IMPRESSION: No change from the previous day.   Electronically Signed   By: Alcide Clever M.D.   On: 09/21/2013 07:29   Dg Chest Port 1 View  09/20/2013   CLINICAL DATA:  Endotracheal tube placement  EXAM: PORTABLE CHEST - 1 VIEW  COMPARISON:  Portable chest x-ray of 4 9 and 09/19/2011  FINDINGS: The endotracheal tube tip is only 1.8 cm above the carina and could be withdrawn and other 1.5-2 cm. Pleural and parenchymal opacity in the right mid upper lung field appears relatively stable. The previously noted opacity within the left midlung is no longer seen with bullous changes in the left apex as well. Right-sided Port-A-Cath and and NG tube are present.  IMPRESSION: 1. Endotracheal tube tip only 1.8 cm above the carina. Consider withdrawing 1.5-2 cm. 2. Little change in chronic changes bilaterally particularly in the upper lung fields right greater than left with bullous changes as well as scarring.  No definite infiltrate is currently seen.   Electronically Signed   By: Dwyane DeePaul  Barry M.D.   On: 09/20/2013 15:10   Dg Chest Port 1 View  09/20/2013   CLINICAL DATA:  Possible pneumonia  EXAM: PORTABLE CHEST - 1 VIEW  COMPARISON:  Portable chest x-ray of 09/18/2013  FINDINGS: There is parenchymal opacity in the right mid upper lung field suspicious for pneumonia and followup is recommended. Probable bullous change in the right apex is  stable and right Port-A-Cath remains. The left lung is clear. Heart size is stable. Biapical pleural thickening right greater than left is unchanged.  IMPRESSION: More opacity in the right mid lung suspicious for pneumonia. Recommend continued followup.   Electronically Signed   By: Dwyane DeePaul  Barry M.D.   On: 09/20/2013 08:12   Dg Abd Portable 1v  09/20/2013   CLINICAL DATA:  43 year old female with enteric tube placement. Initial encounter.  EXAM: PORTABLE ABDOMEN - 1 VIEW  COMPARISON:  Portable chest radiograph 1506 hr the same day.  FINDINGS: Portable AP supine view at 1515 hrs. Enteric tube courses to the left upper quadrant. Side hole pole level of the gastric fundus. Non obstructed bowel gas pattern.  IMPRESSION: Enteric tube tip at the gastric body.   Electronically Signed   By: Augusto GambleLee  Paddock M.D.   On: 09/20/2013 15:16    ASSESSMENT / PLAN:  Acute on Chronic Hypercarbic Respiratory Failure HCAP MAI  COPD Pulmonary HTN   Plan: -full vent support, 550 / 15 / 5 / adjust fiO2 to support sats >88% -Trial SBT 5/5 with ABG in 30 minutes 4/10 -trend CXR -continue solumedrol -continue scheduled duoneb -abx per ID  -if this is indeed her third intubation, (confirmed 2 per recent documentation), will need to discuss with patient future goals of care -daily SBT / WUA -fentanyl gtt per primary     Canary BrimBrandi Ollis, NP-C [place] [facility] Pgr: [phone] or [phone] 09/21/2013, 11:55 AM  CC time 40 minutes  Levy Pupaobert Dimples Probus, MD, PhD 09/22/2013, 2:52 PM [facility] [phone] or if no answer [phone]

## 2013-09-22 DIAGNOSIS — J9620 Acute and chronic respiratory failure, unspecified whether with hypoxia or hypercapnia: Secondary | ICD-10-CM

## 2013-09-22 DIAGNOSIS — J189 Pneumonia, unspecified organism: Secondary | ICD-10-CM

## 2013-09-22 LAB — BLOOD GAS, ARTERIAL
Acid-Base Excess: 10.6 mmol/L — ABNORMAL HIGH (ref 0.0–2.0)
BICARBONATE: 35.7 meq/L — AB (ref 20.0–24.0)
FIO2: 0.4 %
MECHVT: 550 mL
O2 Saturation: 98.7 %
PEEP: 5 cmH2O
PH ART: 7.414 (ref 7.350–7.450)
Patient temperature: 97.3
RATE: 15 resp/min
TCO2: 37.5 mmol/L (ref 0–100)
pCO2 arterial: 56.3 mmHg — ABNORMAL HIGH (ref 35.0–45.0)
pO2, Arterial: 107 mmHg — ABNORMAL HIGH (ref 80.0–100.0)

## 2013-09-22 LAB — CBC
HCT: 26.3 % — ABNORMAL LOW (ref 36.0–46.0)
HEMOGLOBIN: 8.2 g/dL — AB (ref 12.0–15.0)
MCH: 24.9 pg — ABNORMAL LOW (ref 26.0–34.0)
MCHC: 31.2 g/dL (ref 30.0–36.0)
MCV: 79.9 fL (ref 78.0–100.0)
Platelets: 308 10*3/uL (ref 150–400)
RBC: 3.29 MIL/uL — ABNORMAL LOW (ref 3.87–5.11)
RDW: 18.6 % — AB (ref 11.5–15.5)
WBC: 6.4 10*3/uL (ref 4.0–10.5)

## 2013-09-22 LAB — BASIC METABOLIC PANEL
BUN: 13 mg/dL (ref 6–23)
CO2: 31 mEq/L (ref 19–32)
Calcium: 9.2 mg/dL (ref 8.4–10.5)
Chloride: 98 mEq/L (ref 96–112)
Creatinine, Ser: 0.39 mg/dL — ABNORMAL LOW (ref 0.50–1.10)
Glucose, Bld: 115 mg/dL — ABNORMAL HIGH (ref 70–99)
POTASSIUM: 4 meq/L (ref 3.7–5.3)
SODIUM: 140 meq/L (ref 137–147)

## 2013-09-23 LAB — BLOOD GAS, ARTERIAL
Acid-Base Excess: 9.5 mmol/L — ABNORMAL HIGH (ref 0.0–2.0)
Acid-Base Excess: 9.3 mmol/L — ABNORMAL HIGH (ref 0.0–2.0)
BICARBONATE: 34.9 meq/L — AB (ref 20.0–24.0)
BICARBONATE: 34.7 meq/L — AB (ref 20.0–24.0)
FIO2: 0.4 %
FIO2: 40 %
LHR: 15 {breaths}/min
MECHVT: 550 mL
O2 SAT: 98.2 %
O2 Saturation: 96.4 %
PATIENT TEMPERATURE: 97.7
PCO2 ART: 61.3 mmHg — AB (ref 35.0–45.0)
PEEP/CPAP: 5 cmH2O
PEEP: 5 cmH2O
PH ART: 7.373 (ref 7.350–7.450)
PO2 ART: 91.5 mmHg (ref 80.0–100.0)
Patient temperature: 98.6
Pressure support: 8 cmH2O
TCO2: 36.7 mmol/L (ref 0–100)
TCO2: 36.6 mmol/L (ref 0–100)
pCO2 arterial: 59.8 mmHg (ref 35.0–45.0)
pH, Arterial: 7.379 (ref 7.350–7.450)
pO2, Arterial: 114 mmHg — ABNORMAL HIGH (ref 80.0–100.0)

## 2013-09-23 LAB — VANCOMYCIN, TROUGH: VANCOMYCIN TR: 31.1 ug/mL — AB (ref 10.0–20.0)

## 2013-09-24 NOTE — Progress Notes (Signed)
   Name: Lauren Alvarado MRN: [id number] DOB: 07/03/1970    ADMISSION DATE:  09/06/2013 CONSULTATION DATE:  09/20/13 REFERRING MD :  Dr. Arnette NorrisBarakat [facility]  CHIEF COMPLAINT:  Acute on Chronic Respiratory Failure   BRIEF PATIENT DESCRIPTION: 43 [facility] F [facility]hab efforts post admit for HCAP in the setting of known COPD, MAI, & Chronic Pain.    SIGNIFICANT EVENTS: 3/31 - 3/26:  [facility], found to have hypercarbic respiratory fx 2/2  HCAP  Required intubation.  Extubated prior to d/c ................................................................................................................................................................................ 3/26 - [facility] [place] for cardiopulmonary rehab efforts 4/07 - Noted to have increased O2 needs 4/09 - Failed bipap, intubated per Dr. Arnette NorrisBarakat 4/10 - Wide awake on vent, ABG 7.25, pcO2 79 (?validity) - PSV 5/5 with ABG in 30 min 4/12 - extubated, no distress on [place] O2  STUDIES:    LINES / TUBES: OETT 4/9>>>4/12 R CW Port 4/9>>>  CULTURES: Sputum Culture 4/9>>>few yeast>>> BCx2 4/8>>>  ANTIBIOTICS: Per SSH, See MAR Vanco 4/9>>> Meropenem 4/8>>>  VITAL SIGNS: Reviewed at bedside.  WNL   PHYSICAL EXAMINATION: General:  Chronically ill in NAD Neuro:  AAOx4, speech clear, MAE HEENT: mm pink/moist Cardiovascular:  s1s2 rrr, no m/r/g Lungs:  resp's even/non-labored, lungs bilaterally clear Abdomen:  Round/soft, bsx4 active  Musculoskeletal:  No acute deformities  Skin:  Warm/dry, no edema   Recent Labs Lab 09/20/13 0433 09/21/13 0726 09/22/13 0720  NA 141 140 140  K 4.0 4.9 4.0  CL 96 98 98  CO2 30 28 31   BUN 12 12 13   CREATININE 0.39* 0.36* 0.39*  GLUCOSE 110* 82 115*    Recent Labs Lab 09/20/13 0433 09/21/13 0726 09/22/13 0720  HGB 8.4* 8.8* 8.2*  HCT 28.0* 29.2* 26.3*  WBC 9.1 9.8 6.4  PLT 301 231 308   No results  found.  ASSESSMENT / PLAN:  Acute on Chronic Hypercarbic Respiratory Failure HCAP MAI  COPD Pulmonary HTN   Plan: -trend CXR -continue solumedrol with transition to prednisone taper -continue scheduled duoneb -abx per ID  -need to discuss goals of care / re: further intubations -pulmonary hygiene, mobilize   PCCM will sign off.  Please call back if we can be of further assistance.   Canary BrimBrandi Rhetta Cleek, NP-C [place] [facility] Pgr: [phone] or [phone] 09/24/2013, 11:48 AM

## 2013-09-24 NOTE — Progress Notes (Signed)
[facility]                                                                                              Progress note     Patient Demographics  Lauren ArenaSusan Alvarado, is a 43 y.o. female  NGE:[id number]  MWN:[id number]  DOB - 1971/05/21  Admit date - 09/06/2013  Admitting Physician Elnora MorrisonAhmad B Barakat, MD  Outpatient Primary MD for the patient is No PCP Per Patient  LOS - 18   Chief complaint Respiratory failure          Subjective:   Lauren Alvarado feels weak all over. No specific complaints  Objective:   Vital signs  Temperature 99.0  Heart rate 70 Respiratory rate 22 Blood pressure 152/76 Pulse ox 99%  Exam Awake Alert, Oriented X 3, No new F.N deficits,  [place].AT,PERRAL Supple Neck,No JVD, No cervical lymphadenopathy appriciated.  Symmetrical Chest wall movement, decreased breath sounds at the bases, mild scattered rhonchi RRR,No Gallops,Rubs or new Murmurs, No Parasternal Heave +ve B.Sounds, Abd Soft, Non tender, No organomegaly appriciated, No rebound - guarding or rigidity. No Cyanosis, Clubbing or edema, No new Rash or bruise   Knee is examined no effusion noted , no hotness or redness   I&Os 1883/3150 Foley, yes   Data Review   CBC  Recent Labs Lab 09/19/13 0710 09/20/13 0433 09/21/13 0726 09/22/13 0720  WBC 14.6* 9.1 9.8 6.4  HGB 8.3* 8.4* 8.8* 8.2*  HCT 27.9* 28.0* 29.2* 26.3*  PLT 308 301 231 308  MCV 84.0 82.8 81.1 79.9  MCH 25.0* 24.9* 24.4* 24.9*  MCHC 29.7* 30.0 30.1 31.2  RDW 18.5* 18.3* 18.6* 18.6*  LYMPHSABS 1.2  --   --   --   MONOABS 0.9  --   --   --   EOSABS 0.3  --   --   --   BASOSABS 0.0  --   --   --     Chemistries   Recent Labs Lab 09/19/13 0710 09/20/13 0433 09/21/13 0726 09/22/13 0720  NA 142 141 140 140  K 3.7 4.0 4.9 4.0  CL 99 96 98 98  CO2 31 30 28 31   GLUCOSE 114* 110* 82 115*  BUN 10 12 12 13   CREATININE 0.46* 0.39* 0.36*  0.39*  CALCIUM 9.3 9.3 9.6 9.2    Tracheal aspirate culture shows few weeks   Assessment & Plan   Respiratory failure, status post intubation and extubation yesterday, on 5 L Oxymizer Severe COPD; 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 SVT by monitor, rate controlled  Plan Decrease steroids Check labs in a.m.   DVT Prophylaxis  Lovenox   Carron CurieAli Cammi Consalvo M.D on 09/24/2013 at 11:03 AM

## 2013-09-25 LAB — BASIC METABOLIC PANEL
BUN: 12 mg/dL (ref 6–23)
CHLORIDE: 101 meq/L (ref 96–112)
CO2: 34 mEq/L — ABNORMAL HIGH (ref 19–32)
Calcium: 9.1 mg/dL (ref 8.4–10.5)
Creatinine, Ser: 0.3 mg/dL — ABNORMAL LOW (ref 0.50–1.10)
GFR calc Af Amer: >90 mL/min (ref >90)
GFR calc non Af Amer: >90 mL/min (ref >90)
GLUCOSE: 99 mg/dL (ref 70–99)
POTASSIUM: 4.1 meq/L (ref 3.7–5.3)
Sodium: 142 mEq/L (ref 137–147)

## 2013-09-25 LAB — CULTURE, BLOOD (ROUTINE X 2)
CULTURE: NO GROWTH
Culture: NO GROWTH

## 2013-09-25 LAB — CBC
HCT: 28.6 % — ABNORMAL LOW (ref 36.0–46.0)
HEMOGLOBIN: 8.5 g/dL — AB (ref 12.0–15.0)
MCH: 24.1 pg — ABNORMAL LOW (ref 26.0–34.0)
MCHC: 29.7 g/dL — AB (ref 30.0–36.0)
MCV: 81 fL (ref 78.0–100.0)
Platelets: 386 10*3/uL (ref 150–400)
RBC: 3.53 MIL/uL — ABNORMAL LOW (ref 3.87–5.11)
RDW: 19.6 % — ABNORMAL HIGH (ref 11.5–15.5)
WBC: 4.8 10*3/uL (ref 4.0–10.5)

## 2013-09-25 LAB — CULTURE, RESPIRATORY

## 2013-09-25 LAB — CULTURE, RESPIRATORY W GRAM STAIN

## 2013-09-25 NOTE — Progress Notes (Signed)
[facility]                                                                                              Progress note     Patient Demographics  Lauren Alvarado Craine, is a 43 y.o. female  ZOX:[id number]  WJX:[id number]  DOB - 09-Sep-1970  Admit date - 09/06/2013  Admitting Physician Elnora MorrisonAhmad B Barakat, MD  Outpatient Primary MD for the patient is No PCP Per Patient  LOS - 19   Chief complaint Respiratory failure          Subjective:   Lauren Alvarado Nickey feels weak all over. Complains of mild shortness of breath  Objective:   Vital signs  Temperature 97.6  Heart rate 75 Respiratory rate 14 Blood pressure 150/90 Pulse ox 99%  Exam Awake Alert, Oriented X 3, No new F.N deficits,  [place].AT,PERRAL Supple Neck,No JVD, No cervical lymphadenopathy appriciated.  Symmetrical Chest wall movement, decreased breath sounds at the bases, mild scattered rhonchi RRR,No Gallops,Rubs or new Murmurs, No Parasternal Heave +ve B.Sounds, Abd Soft, Non tender, No organomegaly appriciated, No rebound - guarding or rigidity. No Cyanosis, Clubbing or edema, No new Rash or bruise      I&Os 1420/1850 Foley, yes   Data Review   CBC  Recent Labs Lab 09/19/13 0710 09/20/13 0433 09/21/13 0726 09/22/13 0720 09/25/13 0615  WBC 14.6* 9.1 9.8 6.4 4.8  HGB 8.3* 8.4* 8.8* 8.2* 8.5*  HCT 27.9* 28.0* 29.2* 26.3* 28.6*  PLT 308 301 231 308 386  MCV 84.0 82.8 81.1 79.9 81.0  MCH 25.0* 24.9* 24.4* 24.9* 24.1*  MCHC 29.7* 30.0 30.1 31.2 29.7*  RDW 18.5* 18.3* 18.6* 18.6* 19.6*  LYMPHSABS 1.2  --   --   --   --   MONOABS 0.9  --   --   --   --   EOSABS 0.3  --   --   --   --   BASOSABS 0.0  --   --   --   --     Chemistries   Recent Labs Lab 09/19/13 0710 09/20/13 0433 09/21/13 0726 09/22/13 0720 09/25/13 0615  NA 142 141 140 140 142  K 3.7 4.0 4.9 4.0 4.1  CL 99 96 98 98 101  CO2 31 30 28 31  34*  GLUCOSE 114*  110* 82 115* 99  BUN 10 12 12 13 12   CREATININE 0.46* 0.39* 0.36* 0.39* 0.30*  CALCIUM 9.3 9.3 9.6 9.2 9.1    Tracheal aspirate culture shows few yeast done on 4/11   Assessment & Plan   Respiratory failure, status post intubation and extubation on Sunday, on 3 L MAM by nasal cannula and BiPAP each bedtime Severe COPD; 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 SVT by monitor, rate controlled  Plan Check portable chest x-ray in am check BNP in a.m. Lasix 20 mg daily by mouth  DVT Prophylaxis  Lovenox   Carron CurieAli Zelda Reames M.D on 09/25/2013 at 10:34 AM

## 2013-09-26 ENCOUNTER — Other Ambulatory Visit (HOSPITAL_COMMUNITY): Payer: Medicare Other

## 2013-09-26 LAB — BLOOD GAS, ARTERIAL
Acid-Base Excess: 10.2 mmol/L — ABNORMAL HIGH (ref 0.0–2.0)
BICARBONATE: 35.4 meq/L — AB (ref 20.0–24.0)
O2 Content: 6 L/min
O2 Saturation: 97.9 %
PATIENT TEMPERATURE: 98.1
PH ART: 7.399 (ref 7.350–7.450)
TCO2: 37.2 mmol/L (ref 0–100)
pCO2 arterial: 58.2 mmHg (ref 35.0–45.0)
pO2, Arterial: 77.8 mmHg — ABNORMAL LOW (ref 80.0–100.0)

## 2013-09-26 LAB — PRO B NATRIURETIC PEPTIDE: Pro B Natriuretic peptide (BNP): 498.4 pg/mL — ABNORMAL HIGH (ref 0–125)

## 2013-09-26 NOTE — Progress Notes (Signed)
[facility]                                                                                              Progress note     Patient Demographics  Lauren Alvarado, is a 43 y.o. female  FAO:[id number]  [id number]  DOB - 04-08-1971  Admit date - 09/06/2013  Admitting Physician Elnora MorrisonAhmad B Barakat, MD  Outpatient Primary MD for the patient is No PCP Per Patient  LOS - 20   Chief complaint Respiratory failure          Subjective:   Lauren Alvarado feels weak all over. Complains of mild shortness of breath  Objective:   Vital signs  Temperature 97.6  Heart rate 75 Respiratory rate 14 Blood pressure 150/90 Pulse ox 99%  Exam Awake Alert, Oriented X 3, No new F.N deficits,  [place].AT,PERRAL Supple Neck,No JVD, No cervical lymphadenopathy appriciated.  Symmetrical Chest wall movement, decreased breath sounds at the bases, mild scattered rhonchi RRR,No Gallops,Rubs or new Murmurs, No Parasternal Heave +ve B.Sounds, Abd Soft, Non tender, No organomegaly appriciated, No rebound - guarding or rigidity. No Cyanosis, Clubbing or edema, No new Rash or bruise      I&Os 1420/1850 Foley, yes   Data Review   CBC  Recent Labs Lab 09/20/13 0433 09/21/13 0726 09/22/13 0720 09/25/13 0615  WBC 9.1 9.8 6.4 4.8  HGB 8.4* 8.8* 8.2* 8.5*  HCT 28.0* 29.2* 26.3* 28.6*  PLT 301 231 308 386  MCV 82.8 81.1 79.9 81.0  MCH 24.9* 24.4* 24.9* 24.1*  MCHC 30.0 30.1 31.2 29.7*  RDW 18.3* 18.6* 18.6* 19.6*    Chemistries   Recent Labs Lab 09/20/13 0433 09/21/13 0726 09/22/13 0720 09/25/13 0615  NA 141 140 140 142  K 4.0 4.9 4.0 4.1  CL 96 98 98 101  CO2 30 28 31  34*  GLUCOSE 110* 82 115* 99  BUN 12 12 13 12   CREATININE 0.39* 0.36* 0.39* 0.30*  CALCIUM 9.3 9.6 9.2 9.1    Tracheal aspirate culture shows few yeast done on 4/11   Assessment & Plan   Respiratory failure, status post intubation and  extubation on Sunday, on 3 L MAM by nasal cannula and BiPAP each bedtime Severe COPD; 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 SVT by monitor, rate controlled  Plan Continue same medications Check CBC BMP in a.m. DVT Prophylaxis  Lovenox   Carron CurieAli Dennys Traughber M.D on 09/26/2013 at 12:01 PM

## 2013-09-26 NOTE — Progress Notes (Signed)
[facility]                                                                                              Progress note     Patient Demographics  Lauren Alvarado, is a 43 y.o. female  ZOX:[id number]  WJX:[id number]  DOB - Jan 14, 1971  Admit date - 09/06/2013  Admitting Physician Elnora MorrisonAhmad B Barakat, MD  Outpatient Primary MD for the patient is No PCP Per Patient  LOS - 20   Chief complaint Respiratory failure          Subjective:   Lauren Alvarado feels weak all over.   Objective:   Vital signs  Temperature 97.9  Heart rate 79 Respiratory rate 16 Blood pressure 131/77 Pulse ox 97%  Exam Awake Alert, Oriented X 3, No new F.N deficits,  [place].AT,PERRAL Supple Neck,No JVD, No cervical lymphadenopathy appriciated.  Symmetrical Chest wall movement, decreased breath sounds at the bases, mild scattered rhonchi RRR,No Gallops,Rubs or new Murmurs, No Parasternal Heave +ve B.Sounds, Abd Soft, Non tender, No organomegaly appriciated, No rebound - guarding or rigidity. No Cyanosis, Clubbing or edema, No new Rash or bruise      I&Os 1480/2100 Foley, yes   Data Review   CBC  Recent Labs Lab 09/20/13 0433 09/21/13 0726 09/22/13 0720 09/25/13 0615  WBC 9.1 9.8 6.4 4.8  HGB 8.4* 8.8* 8.2* 8.5*  HCT 28.0* 29.2* 26.3* 28.6*  PLT 301 231 308 386  MCV 82.8 81.1 79.9 81.0  MCH 24.9* 24.4* 24.9* 24.1*  MCHC 30.0 30.1 31.2 29.7*  RDW 18.3* 18.6* 18.6* 19.6*    Chemistries   Recent Labs Lab 09/20/13 0433 09/21/13 0726 09/22/13 0720 09/25/13 0615  NA 141 140 140 142  K 4.0 4.9 4.0 4.1  CL 96 98 98 101  CO2 30 28 31  34*  GLUCOSE 110* 82 115* 99  BUN 12 12 13 12   CREATININE 0.39* 0.36* 0.39* 0.30*  CALCIUM 9.3 9.6 9.2 9.1    Tracheal aspirate culture shows few yeast done on 4/11   Assessment & Plan   Respiratory failure, status post intubation and extubation on Sunday, on 3 L by  nasal cannula and BiPAP at bedtime Severe COPD; 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 SVT by monitor, rate controlled  Plan Check portable chest x-ray in am check BNP in a.m. Lasix 20 mg daily by mouth  DVT Prophylaxis  Lovenox   Carron CurieAli Daishia Fetterly M.D on 09/26/2013 at 11:44 AM

## 2013-09-27 NOTE — Progress Notes (Signed)
[facility]                                                                                              Progress note     Patient Demographics  Lauren Alvarado, is a 43 y.o. female  ZOX:[id number]  WJX:[id number]  DOB - 1971-04-16  Admit date - 09/06/2013  Admitting Physician Elnora MorrisonAhmad B Barakat, MD  Outpatient Primary MD for the patient is No PCP Per Patient  LOS - 21   Chief complaint Respiratory failure          Subjective:   Lauren Alvarado feels okay today no new complaints  Objective:   Vital signs  Temperature 97.3 Heart rate 70 Respiratory rate 14 Blood pressure 110/60 Pulse ox 93%  Exam Awake Alert, Oriented X 3, No new F.N deficits,  [place].AT,PERRAL Supple Neck,No JVD, No cervical lymphadenopathy appriciated.  Symmetrical Chest wall movement, decreased breath sounds at the bases, mild scattered rhonchi RRR,No Gallops,Rubs or new Murmurs, No Parasternal Heave +ve B.Sounds, Abd Soft, Non tender, No organomegaly appriciated, No rebound - guarding or rigidity. No Cyanosis, Clubbing or edema, No new Rash or bruise      I&Os 580/479 Foley, yes   Data Review   CBC  Recent Labs Lab 09/21/13 0726 09/22/13 0720 09/25/13 0615  WBC 9.8 6.4 4.8  HGB 8.8* 8.2* 8.5*  HCT 29.2* 26.3* 28.6*  PLT 231 308 386  MCV 81.1 79.9 81.0  MCH 24.4* 24.9* 24.1*  MCHC 30.1 31.2 29.7*  RDW 18.6* 18.6* 19.6*    Chemistries   Recent Labs Lab 09/21/13 0726 09/22/13 0720 09/25/13 0615  NA 140 140 142  K 4.9 4.0 4.1  CL 98 98 101  CO2 28 31 34*  GLUCOSE 82 115* 99  BUN 12 13 12   CREATININE 0.36* 0.39* 0.30*  CALCIUM 9.6 9.2 9.1    Tracheal aspirate culture shows few yeast done on 4/11   Assessment & Plan   Respiratory failure, status post intubation and extubation on Sunday, on 3 L by nasal cannula and BiPAP each bedtime Severe COPD; not a lung transplant candidate due to chronic  MAI , continue with prednisone and duonebs [facility] acquired pneumonia off antibiotics MAI complex, ethambutol l, rifabutin, since 2007; 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 SVT by monitor, rate controlled  Plan DC vancomycin DVT prophylaxis; Lovenox   Carron CurieAli Eilyn Polack M.D on 09/27/2013 at 11:34 AM

## 2014-06-14 DEATH — deceased

## 2014-10-12 IMAGING — CR DG CHEST 1V PORT
1 series · 1 of 1 positions shown · non-contrast
Comparison: 09/06/2013.

CLINICAL DATA: Shortness of breath.

EXAM:
PORTABLE CHEST - 1 VIEW

[AP]
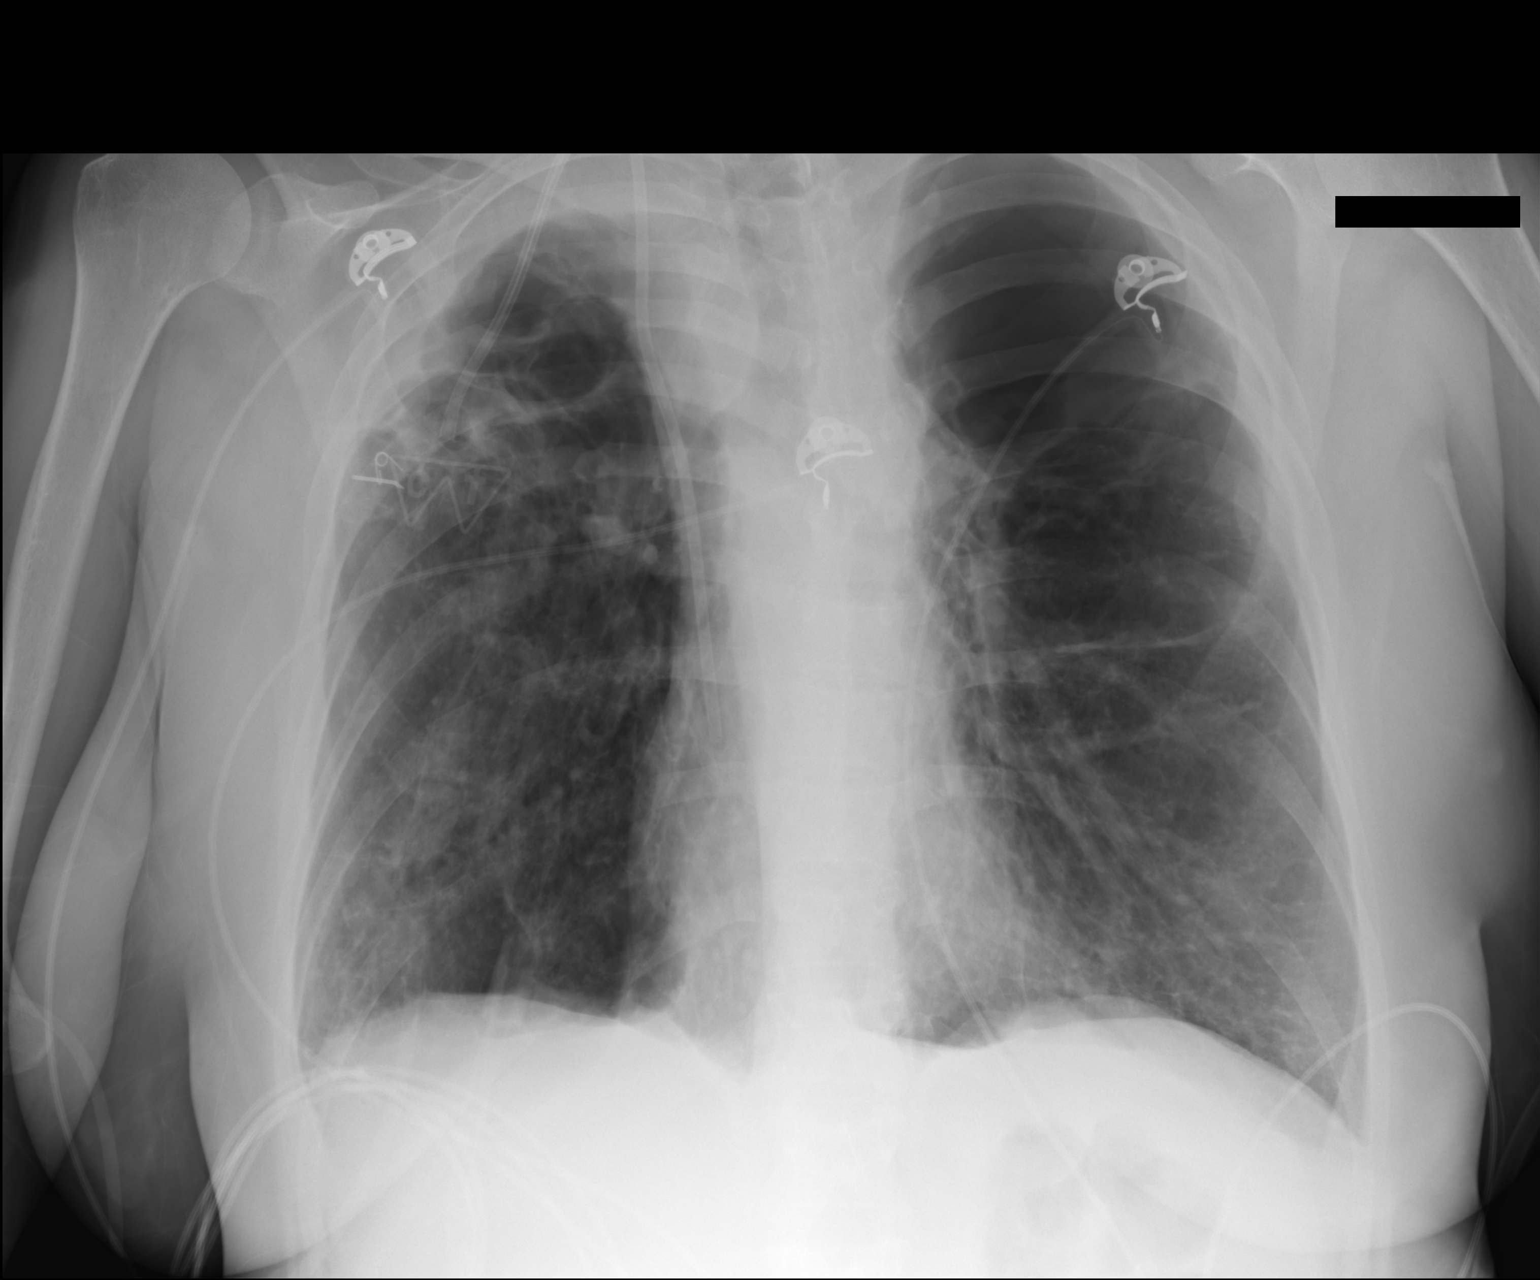

[1 of 1 positions shown; findings below may reference images not displayed]

FINDINGS: A power port is stable. Stable advanced emphysematous changes with
apical bulla. There is compressive atelectasis and pulmonary
scarring. No pleural effusion or focal infiltrate.
IMPRESSION: Advanced emphysematous changes and pulmonary scarring without
definite acute overlying pulmonary process.

## 2014-10-17 IMAGING — CR DG CHEST 1V PORT
1 series · 1 of 1 positions shown · non-contrast
Comparison: Portable chest x-ray of [REDACTED]

CLINICAL DATA: Endotracheal tube placement

EXAM:
PORTABLE CHEST - 1 VIEW

[AP]
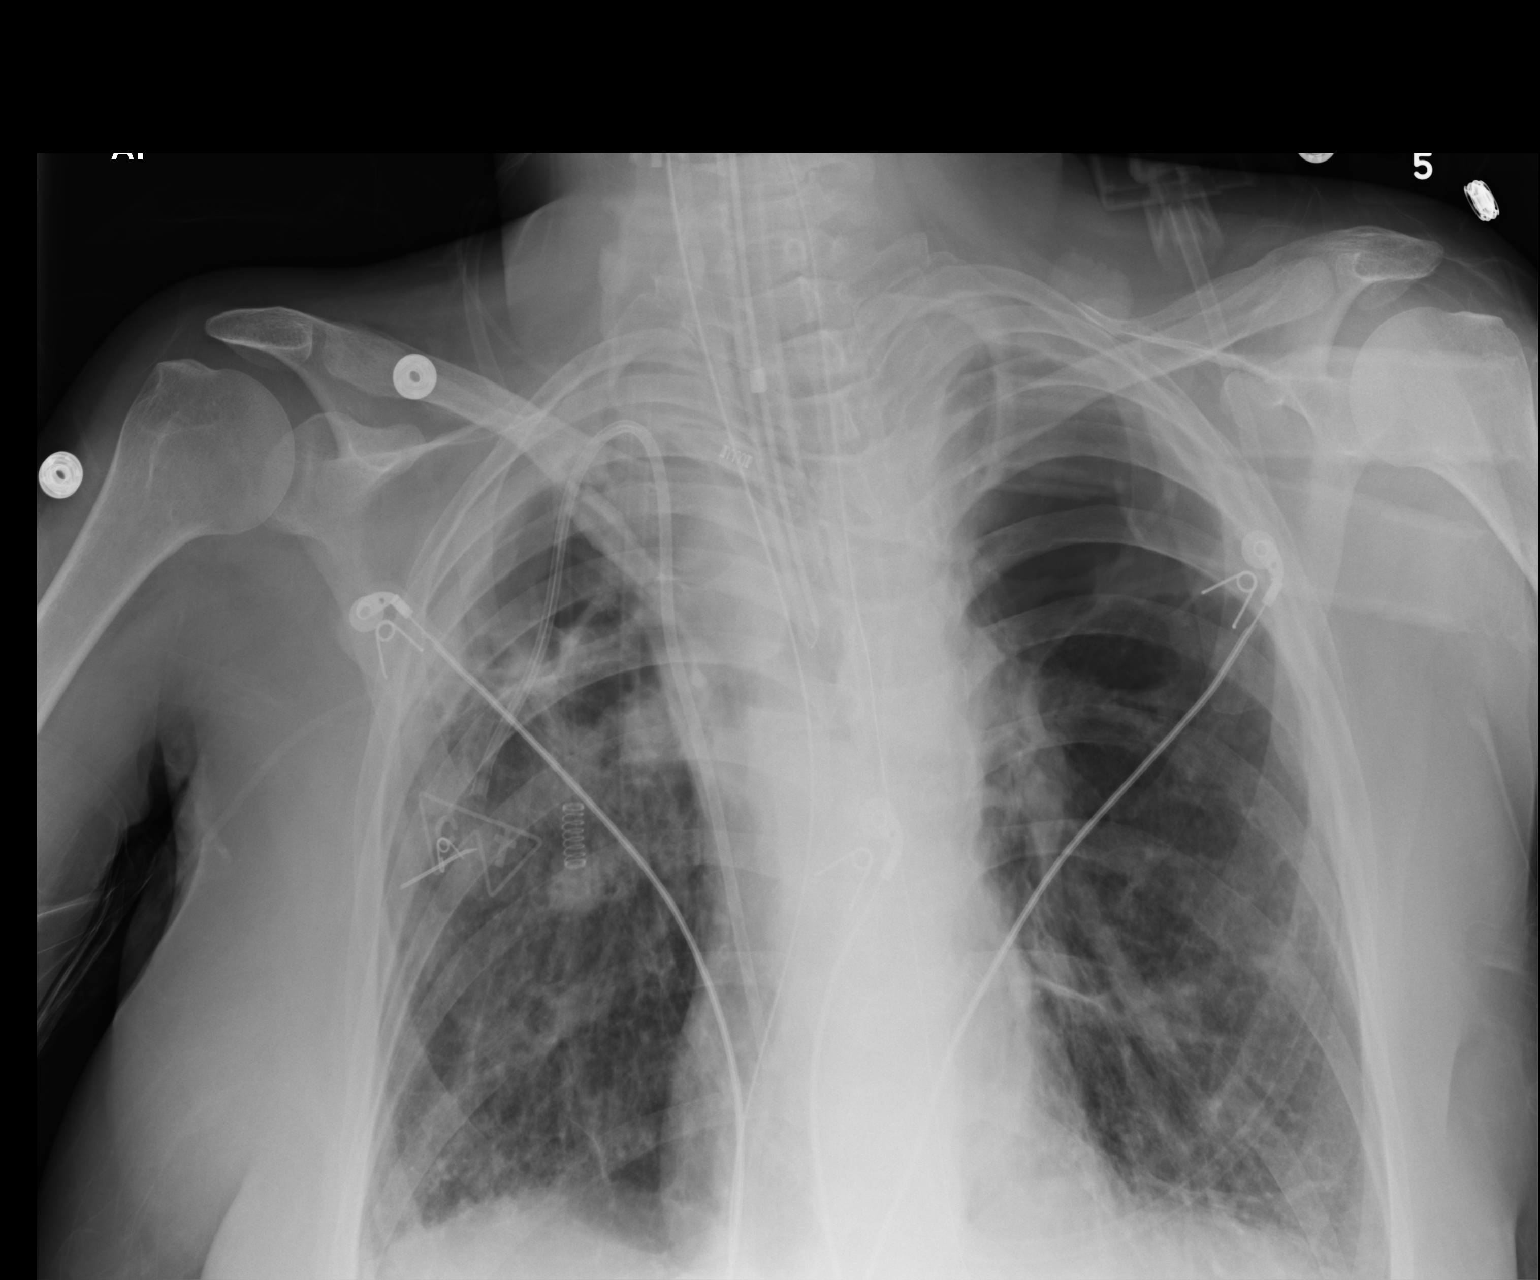

[1 of 1 positions shown; findings below may reference images not displayed]

FINDINGS: The endotracheal tube tip is only 1.8 cm above the carina and could
be withdrawn and other 1.5-2 cm. Pleural and parenchymal opacity in
the right mid upper lung field appears relatively stable. The
previously noted opacity within the left midlung is no longer seen
with bullous changes in the left apex as well. Right-sided
Port-A-Cath and and NG tube are present.
IMPRESSION: 1. Endotracheal tube tip only 1.8 cm above the carina. Consider
withdrawing 1.5-2 cm.
2. Little change in chronic changes bilaterally particularly in the
upper lung fields right greater than left with bullous changes as
well as scarring. No definite infiltrate is currently seen.

## 2014-10-23 IMAGING — DX DG CHEST 1V PORT
1 series · 1 of 1 positions shown · non-contrast
Comparison: September 21, 2013

CLINICAL DATA: Respiratory failure

EXAM:
PORTABLE CHEST - 1 VIEW

[portable]
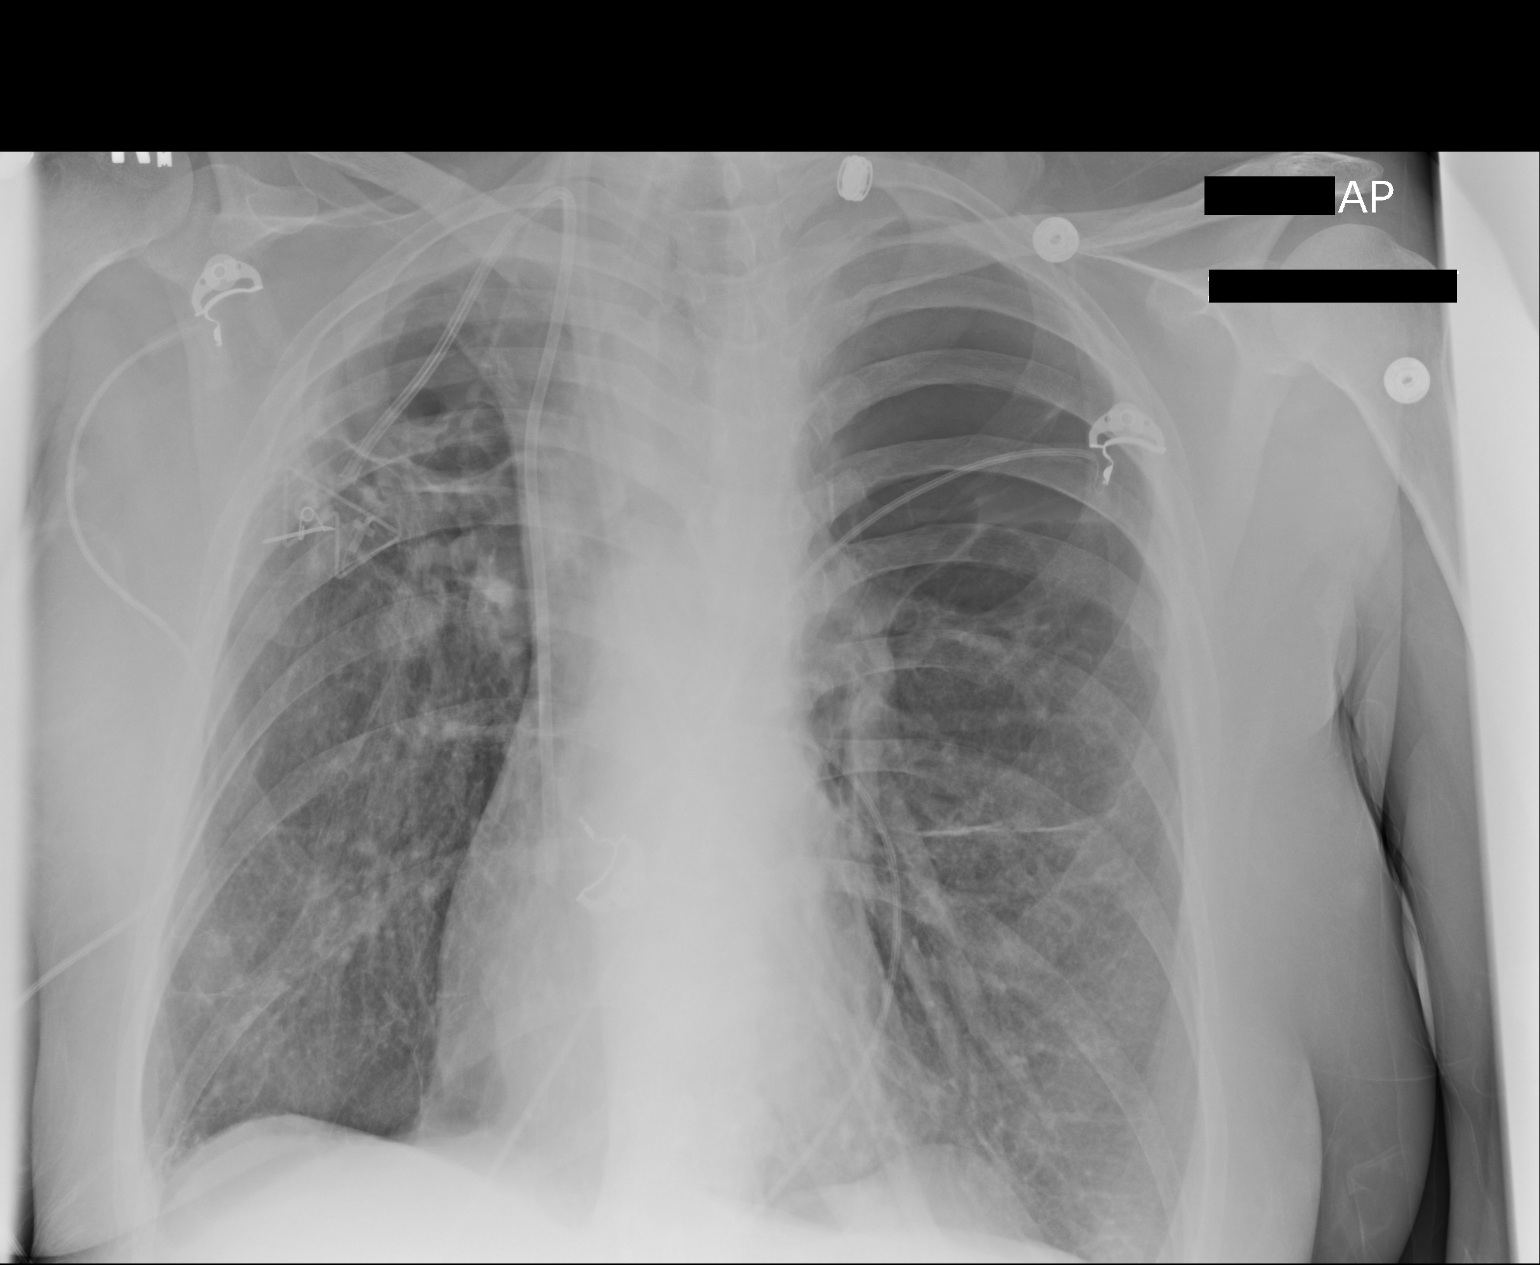

[1 of 1 positions shown; findings below may reference images not displayed]

FINDINGS: There is underlying emphysematous change. There is extensive bullous
disease in the upper lobes bilaterally, more on the left than on the
right. There scarring in the right upper lobe. There is mild
superimposed interstitial prominence which may represent
redistribution of blood flow to a viable segments of lung. Heart is
upper normal in size.

Port-A-Cath tip is at the cavoatrial junction. Nasogastric tube and
endotracheal tube have been removed. There is no demonstrable
pneumothorax.
IMPRESSION: Endotracheal tube and nasogastric tube have been removed without
pneumothorax. Underlying emphysema. Interstitial prominence is
stable and may in part reflect scarring and may be otherwise
represent redistribution of blood flow to a viable segments of lung.
A mild degree of underlying congestive heart failure is possible,
however. Note that the degree of scarring in the right upper lobe is
stable as is the degree of bullous change in the right upper lobes.
